# Patient Record
Sex: Male | Born: 1995 | Race: White | Hispanic: No | Marital: Single | Smoking: Current every day smoker
Health system: Southern US, Community
[De-identification: ages and names within clinical notes are randomized; demographics above are authoritative.]

## PROBLEM LIST (undated history)

## (undated) DIAGNOSIS — F419 Anxiety disorder, unspecified: Secondary | ICD-10-CM

## (undated) DIAGNOSIS — F329 Major depressive disorder, single episode, unspecified: Secondary | ICD-10-CM

## (undated) DIAGNOSIS — F32A Depression, unspecified: Secondary | ICD-10-CM

## (undated) HISTORY — DX: Depression, unspecified: F32.A

## (undated) HISTORY — DX: Anxiety disorder, unspecified: F41.9

## (undated) HISTORY — DX: Major depressive disorder, single episode, unspecified: F32.9

---
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
## 2015-01-11 ENCOUNTER — Other Ambulatory Visit: Payer: Self-pay | Admitting: Physician Assistant

## 2016-06-30 DIAGNOSIS — K080 Exfoliation of teeth due to systemic causes: Secondary | ICD-10-CM | POA: Diagnosis not present

## 2017-01-13 DIAGNOSIS — K080 Exfoliation of teeth due to systemic causes: Secondary | ICD-10-CM | POA: Diagnosis not present

## 2017-01-21 ENCOUNTER — Other Ambulatory Visit: Payer: Self-pay

## 2017-01-21 ENCOUNTER — Emergency Department (HOSPITAL_COMMUNITY): Payer: Federal, State, Local not specified - PPO

## 2017-01-21 ENCOUNTER — Ambulatory Visit (INDEPENDENT_AMBULATORY_CARE_PROVIDER_SITE_OTHER): Payer: Federal, State, Local not specified - PPO | Admitting: Family Medicine

## 2017-01-21 ENCOUNTER — Encounter (HOSPITAL_COMMUNITY): Payer: Self-pay | Admitting: Emergency Medicine

## 2017-01-21 ENCOUNTER — Encounter: Payer: Self-pay | Admitting: Family Medicine

## 2017-01-21 ENCOUNTER — Emergency Department (HOSPITAL_COMMUNITY)
Admission: EM | Admit: 2017-01-21 | Discharge: 2017-01-21 | Disposition: A | Discharge status: Home / Self Care | Payer: Federal, State, Local not specified - PPO | Attending: Emergency Medicine | Admitting: Emergency Medicine

## 2017-01-21 ENCOUNTER — Encounter (INDEPENDENT_AMBULATORY_CARE_PROVIDER_SITE_OTHER): Payer: Self-pay

## 2017-01-21 ENCOUNTER — Telehealth: Payer: Self-pay | Admitting: Family Medicine

## 2017-01-21 ENCOUNTER — Ambulatory Visit (HOSPITAL_COMMUNITY)
Admission: RE | Admit: 2017-01-21 | Discharge: 2017-01-21 | Disposition: A | Discharge status: Home / Self Care | Payer: Federal, State, Local not specified - PPO | Source: Ambulatory Visit | Attending: Family Medicine | Admitting: Family Medicine

## 2017-01-21 VITALS — BP 140/100 | HR 72 | Temp 98.8°F | Resp 16 | Ht 76.0 in | Wt 189.1 lb

## 2017-01-21 DIAGNOSIS — R030 Elevated blood-pressure reading, without diagnosis of hypertension: Secondary | ICD-10-CM

## 2017-01-21 DIAGNOSIS — Z23 Encounter for immunization: Secondary | ICD-10-CM | POA: Insufficient documentation

## 2017-01-21 DIAGNOSIS — R51 Headache: Secondary | ICD-10-CM

## 2017-01-21 DIAGNOSIS — F17210 Nicotine dependence, cigarettes, uncomplicated: Secondary | ICD-10-CM | POA: Insufficient documentation

## 2017-01-21 DIAGNOSIS — R519 Headache, unspecified: Secondary | ICD-10-CM

## 2017-01-21 DIAGNOSIS — R918 Other nonspecific abnormal finding of lung field: Secondary | ICD-10-CM | POA: Diagnosis not present

## 2017-01-21 DIAGNOSIS — R55 Syncope and collapse: Secondary | ICD-10-CM | POA: Diagnosis not present

## 2017-01-21 DIAGNOSIS — J301 Allergic rhinitis due to pollen: Secondary | ICD-10-CM

## 2017-01-21 DIAGNOSIS — J302 Other seasonal allergic rhinitis: Secondary | ICD-10-CM | POA: Insufficient documentation

## 2017-01-21 DIAGNOSIS — F418 Other specified anxiety disorders: Secondary | ICD-10-CM | POA: Diagnosis not present

## 2017-01-21 DIAGNOSIS — R040 Epistaxis: Secondary | ICD-10-CM

## 2017-01-21 DIAGNOSIS — L700 Acne vulgaris: Secondary | ICD-10-CM

## 2017-01-21 LAB — URINALYSIS, ROUTINE W REFLEX MICROSCOPIC
BILIRUBIN URINE: NEGATIVE
BILIRUBIN URINE: NEGATIVE
GLUCOSE, UA: NEGATIVE mg/dL
Glucose, UA: NEGATIVE
Hgb urine dipstick: NEGATIVE
Hgb urine dipstick: NEGATIVE
KETONES UR: 5 mg/dL — AB
LEUKOCYTES UA: NEGATIVE
Leukocytes, UA: NEGATIVE
Nitrite: NEGATIVE
Nitrite: NEGATIVE
PH: 6 (ref 5.0–8.0)
Protein, ur: NEGATIVE mg/dL
SPECIFIC GRAVITY, URINE: 1.03 (ref 1.001–1.035)
Specific Gravity, Urine: 1.02 (ref 1.005–1.030)
pH: 6 (ref 5.0–8.0)

## 2017-01-21 LAB — COMPLETE METABOLIC PANEL WITH GFR
ALBUMIN: 5.1 g/dL (ref 3.6–5.1)
ALK PHOS: 62 U/L (ref 40–115)
ALT: 10 U/L (ref 9–46)
AST: 14 U/L (ref 10–40)
BILIRUBIN TOTAL: 0.8 mg/dL (ref 0.2–1.2)
BUN: 15 mg/dL (ref 7–25)
CALCIUM: 10 mg/dL (ref 8.6–10.3)
CO2: 27 mmol/L (ref 20–31)
Chloride: 101 mmol/L (ref 98–110)
Creat: 1.2 mg/dL (ref 0.60–1.35)
GFR, EST NON AFRICAN AMERICAN: 87 mL/min (ref >=60)
GFR, Est African American: >89 mL/min (ref >=60)
Glucose, Bld: 86 mg/dL (ref 65–99)
POTASSIUM: 4.2 mmol/L (ref 3.5–5.3)
Sodium: 139 mmol/L (ref 135–146)
Total Protein: 7.6 g/dL (ref 6.1–8.1)

## 2017-01-21 LAB — BASIC METABOLIC PANEL
ANION GAP: 17 — AB (ref 5–15)
BUN: 19 mg/dL (ref 6–20)
CHLORIDE: 102 mmol/L (ref 101–111)
CO2: 20 mmol/L — AB (ref 22–32)
Calcium: 10.4 mg/dL — ABNORMAL HIGH (ref 8.9–10.3)
Creatinine, Ser: 1.3 mg/dL — ABNORMAL HIGH (ref 0.61–1.24)
GFR calc Af Amer: >60 mL/min (ref >60)
GLUCOSE: 117 mg/dL — AB (ref 65–99)
POTASSIUM: 2.8 mmol/L — AB (ref 3.5–5.1)
Sodium: 139 mmol/L (ref 135–145)

## 2017-01-21 LAB — LIPID PANEL
CHOL/HDL RATIO: 3.5 ratio (ref <5.0)
CHOLESTEROL: 136 mg/dL (ref <200)
HDL: 39 mg/dL — AB (ref >40)
LDL Cholesterol: 84 mg/dL (ref <100)
Triglycerides: 63 mg/dL (ref <150)
VLDL: 13 mg/dL (ref <30)

## 2017-01-21 LAB — URINALYSIS, MICROSCOPIC ONLY
BACTERIA UA: NONE SEEN [HPF]
CRYSTALS: NONE SEEN [HPF]
Casts: NONE SEEN [LPF]
SQUAMOUS EPITHELIAL / LPF: NONE SEEN [HPF] (ref <=5)
Yeast: NONE SEEN [HPF]

## 2017-01-21 LAB — CBC
HEMATOCRIT: 46.8 % (ref 38.5–50.0)
HEMATOCRIT: 47.3 % (ref 39.0–52.0)
HEMOGLOBIN: 16.3 g/dL (ref 13.2–17.1)
HEMOGLOBIN: 17.2 g/dL — AB (ref 13.0–17.0)
MCH: 30.6 pg (ref 27.0–33.0)
MCH: 30.4 pg (ref 26.0–34.0)
MCHC: 34.8 g/dL (ref 32.0–36.0)
MCHC: 36.4 g/dL — AB (ref 30.0–36.0)
MCV: 88 fL (ref 80.0–100.0)
MCV: 83.6 fL (ref 78.0–100.0)
MPV: 9.8 fL (ref 7.5–12.5)
Platelets: 222 10*3/uL (ref 140–400)
Platelets: 296 10*3/uL (ref 150–400)
RBC: 5.32 MIL/uL (ref 4.20–5.80)
RBC: 5.66 MIL/uL (ref 4.22–5.81)
RDW: 13.2 % (ref 11.0–15.0)
RDW: 12.2 % (ref 11.5–15.5)
WBC: 6.1 10*3/uL (ref 3.8–10.8)
WBC: 9.7 10*3/uL (ref 4.0–10.5)

## 2017-01-21 LAB — CBG MONITORING, ED: GLUCOSE-CAPILLARY: 86 mg/dL (ref 65–99)

## 2017-01-21 LAB — TSH: TSH: 3.47 mIU/L (ref 0.40–4.50)

## 2017-01-21 MED ORDER — CLINDAMYCIN PHOS-BENZOYL PEROX 1-5 % EX GEL: Freq: Two times a day (BID) | CUTANEOUS | 0 refills | Status: AC

## 2017-01-21 MED ORDER — SODIUM CHLORIDE 0.9 % IV BOLUS (SEPSIS)
1000.0000 mL | Freq: Once | INTRAVENOUS | Status: AC
  Administered 2017-01-21: 1000 mL via INTRAVENOUS

## 2017-01-21 MED ORDER — ESCITALOPRAM OXALATE 20 MG PO TABS: ORAL_TABLET | ORAL | 11 refills | Status: AC

## 2017-01-21 MED ORDER — POTASSIUM CHLORIDE CRYS ER 20 MEQ PO TBCR
40.0000 meq | EXTENDED_RELEASE_TABLET | Freq: Once | ORAL | Status: AC
  Administered 2017-01-21: 40 meq via ORAL
  Filled 2017-01-21: qty 2

## 2017-01-21 MED ORDER — PROCHLORPERAZINE EDISYLATE 5 MG/ML IJ SOLN
10.0000 mg | Freq: Once | INTRAMUSCULAR | Status: AC
  Administered 2017-01-21: 10 mg via INTRAVENOUS
  Filled 2017-01-21: qty 2

## 2017-01-21 NOTE — ED Notes (Signed)
Ambulated to restroom independently  

## 2017-01-21 NOTE — Progress Notes (Signed)
Chief Complaint  Patient presents with  . Epistaxis    tuesday  . Headache   New to establish with office Here because of a recent event at home, 2 days ago Awakened at 3 am with a headache and ringing in his ears.  He went into the bathroom to splash water in his face, and noticed a nosebleed.  Does not remember anything after that except for waking up on his parent's room floor.  His father is in the room and adds that they heard him come into the room,and that he answered questions, but was "out of it" and slightly dazed in appearance.  NO true LOC.  Did not fall or hit head. The nosebleed was controlled with pressure. The parents cleaned him up and put him back to bed. He complained of a headache and ringing of his ears. Father cleaned up the floor and then went to check on noah, he was well resting in his bed. He states he had nosebleeds as a child but has not had that in many years. He has not had any recent allergy symptoms, blowing his nose, or sneezing. He has not had a nosebleed since that. Another problem identified his anxiety and depression. This is been going off and on since he was a young teen. He worries a lot. He stresses over small events. He is easily upset. Easily irritable. Easily tearful. He sleeps during the day and is up at night. Spends a lot of time playing computer games. He lives at home with his parents and siblings. He does not work. He does not drive. He finished high school but has not yet made plans for additional educational work. He thinks he would be interested in IT. He knows that he can study computers online but has not had the motivation to sign up. Sometimes feels hopeless and helpless. Self-esteem is good. He gets no exercise. He does eat well. He has acne. He had it for many years. He uses a face wash but no other products. Father senses shots are all up-to-date. It is unknown whether he had the Gardasil series. The importance of immunizing against HPV is  discussed with them.  Patient Active Problem List   Diagnosis Date Noted  . Seasonal allergies 01/21/2017  . Acne vulgaris 01/21/2017  . Anxiety with depression 01/21/2017  . Elevated BP without diagnosis of hypertension 01/21/2017  . Need for HPV vaccination 01/21/2017    Outpatient Encounter Prescriptions as of 01/21/2017  Medication Sig  . acetaminophen (TYLENOL) 325 MG tablet Take 650 mg by mouth every 6 (six) hours as needed.  . clindamycin-benzoyl peroxide (BENZACLIN) gel Apply topically 2 (two) times daily.  Marland Kitchen escitalopram (LEXAPRO) 20 MG tablet Half tab daily for a week then one tab a day   No facility-administered encounter medications on file as of 01/21/2017.     Past Medical History:  Diagnosis Date  . Anxiety   . Depression     History reviewed. No pertinent surgical history.  Social History   Social History  . Marital status: Single    Spouse name: N/A  . Number of children: N/A  . Years of education: HS   Occupational History  . unemployed    Social History Main Topics  . Smoking status: Current Every Day Smoker    Types: E-cigarettes    Start date: 09/08/2015  . Smokeless tobacco: Never Used  . Alcohol use No  . Drug use: No  . Sexual activity: Not Currently  Other Topics Concern  . Not on file   Social History Narrative   Lives at home with parents,  Twin sister and brother   Gamer at night   Sleeper during day    Family History  Problem Relation Age of Onset  . Depression Father   . Heart disease Maternal Grandfather 44       massive MI  . Heart disease Paternal Grandfather 45       heart    Review of Systems  Constitutional: Positive for malaise/fatigue. Negative for weight loss.       Daytime fatigue  HENT: Positive for nosebleeds and tinnitus. Negative for congestion and sinus pain.   Eyes: Positive for blurred vision. Negative for double vision.       Question whether distant vision is declining  Respiratory: Negative for  sputum production and shortness of breath.   Cardiovascular: Negative for chest pain, palpitations and orthopnea.  Gastrointestinal: Negative for abdominal pain, constipation, diarrhea and heartburn.  Genitourinary: Negative for dysuria and urgency.  Musculoskeletal: Negative for myalgias and neck pain.  Neurological: Positive for dizziness and headaches.       Last 2 days  Psychiatric/Behavioral: Positive for depression. Negative for suicidal ideas. The patient is nervous/anxious and has insomnia.     BP (!) 140/100 (BP Location: Left Arm, Patient Position: Sitting)   Pulse 72   Temp 98.8 F (37.1 C) (Temporal)   Resp 16   Ht 6\' 4"  (1.93 m)   Wt 189 lb 1.9 oz (85.8 kg)   SpO2 99%   BMI 23.02 kg/m   Physical Exam  Constitutional: He is oriented to person, place, and time. He appears well-developed and well-nourished.  HENT:  Head: Normocephalic and atraumatic.  Right Ear: External ear normal.  Left Ear: External ear normal.  Mouth/Throat: Oropharynx is clear and moist.  Eyes: Conjunctivae are normal. Pupils are equal, round, and reactive to light.  Neck: Normal range of motion. Neck supple. No thyromegaly present.  Cardiovascular: Normal rate, regular rhythm and normal heart sounds.   Pulmonary/Chest: Effort normal and breath sounds normal. No respiratory distress.  Abdominal: Soft. Bowel sounds are normal.  Musculoskeletal: Normal range of motion. He exhibits no edema.  Lymphadenopathy:    He has no cervical adenopathy.  Neurological: He is alert and oriented to person, place, and time.  Gait normal  Skin: Skin is warm and dry.  Acne vulgaris on face  Psychiatric: He has a normal mood and affect. His speech is normal. Judgment and thought content normal. He is withdrawn. Cognition and memory are normal.  Slightly hesitant.reserved. Looks a father for answers. Polite, intelligent, articulate. Mildly hyperkinetic/jittery  Nursing note and vitals  reviewed. ASSESSMENT/PLAN:   1. Seasonal allergic rhinitis due to pollen   2. Elevated BP without diagnosis of hypertension - CBC - COMPLETE METABOLIC PANEL WITH GFR - Lipid panel - TSH - Urinalysis, Routine w reflex microscopic - DG Chest 2 View; Future - EKG 12-Lead - US Renal Artery Stenosis; Future  3. Epistaxis  4. Anxiety with depression - escitalopram (LEXAPRO) 20 MG tablet; Half tab daily for a week then one tab a day  Dispense: 30 tablet; Refill: 11  5. Acne vulgaris - clindamycin-benzoyl peroxide (BENZACLIN) gel; Apply topically 2 (two) times daily.  Dispense: 25 g; Refill:   Greater than 50% of this visit was spent in counseling and coordinating care.  As above discussed hypertension in the adolescent and necessary workup.  Discussed anxiety and derepression and treatment  options. Total face to face time:  60 min   Patient Instructions  Walk every day that you are able Need: Blood tests - today Chest x ray Ultrasound kidneys  Take the lexapro every day Start with 10 mg a day then increase to 20 mg a day until return  Use clindamycin gel twice a day This helps with acne  See me in 2 weeks     Eustace MooreYvonne Sue Aine Strycharz, MD

## 2017-01-21 NOTE — ED Triage Notes (Addendum)
Patient c/o severe headache to left side of head. Patient woke at 3am On Tuesday with headache, patient went to tell parents and "passed out-nose started bleeding. Patient seen by PCP yesterday and was told blood pressure was high and could be related to kidney function. Patient found in floor today by brother, diaphoretic with chills. Unsure of any fevers. Patient had nose bleed today as well and reports numbness in arms bilaterally. Patient had witnessed syncopal episode on way to hospital, lasting approx 2 minutes per brother. Patient alert and oriented at this time. Denies any sl;urred speech or facial drooping-generalized weakness.

## 2017-01-21 NOTE — Discharge Instructions (Signed)
Continue using Tylenol, or Motrin as needed for headache.  Sure that you are getting plenty of rest, and drinking and eating regularly.  To avoid video screens for a week.

## 2017-01-21 NOTE — ED Provider Notes (Signed)
[facility]-EMERGENCY DEPT Provider Note   CSN: [id number] Arrival date & time: 01/21/17  1848     History   Chief Complaint Chief Complaint  Patient presents with  . Loss of Consciousness    HPI Christian Sweeney is a 21 y.o. male.  He presents for evaluation of headache with syncope.  First noticed headache, 3 days ago, and later that night, passed out for short period, while he was talking to his parents about headache.  Today he went to see her PCP regarding the difficulty, and had screening labs done and was set up for ultrasound of kidneys because his blood pressure was high.  Is also had intermittent bleeding from the nostrils, several times in the last couple of days.  He has a chronic history of nosebleeds.  He is otherwise healthy.  He is passed out a total of 3 times for short period, each time with your headache.  There has been no fever, chills, nausea, vomiting, focal weakness or dizziness.  There are no other known modifying factors.  HPI  Past Medical History:  Diagnosis Date  . Anxiety   . Depression     Patient Active Problem List   Diagnosis Date Noted  . Seasonal allergies 01/21/2017  . Acne vulgaris 01/21/2017  . Anxiety with depression 01/21/2017  . Elevated BP without diagnosis of hypertension 01/21/2017  . Need for HPV vaccination 01/21/2017    History reviewed. No pertinent surgical history.     Home Medications    Prior to Admission medications   Medication Sig Start Date End Date Taking? Authorizing Provider  acetaminophen (TYLENOL) 500 MG tablet Take 1,000 mg by mouth every 6 (six) hours as needed for mild pain, moderate pain, fever or headache.   Yes [provider]  clindamycin-benzoyl peroxide (BENZACLIN) gel Apply topically 2 (two) times daily. 01/21/17   Eustace MooreNelson, Yvonne Sue, MD  escitalopram Judye Bos(LEXAPRO) 20 MG tablet Half tab daily for a week then one tab a day 01/21/17   Eustace MooreNelson, Yvonne Sue, MD    Family History Family History  Problem  Relation Age of Onset  . Depression Father   . Heart disease Maternal Grandfather [id number]       massive MI  . Heart disease Paternal Grandfather 4766       heart    Social History Social History  Substance Use Topics  . Smoking status: Current Every Day Smoker    Types: E-cigarettes    Start date: 09/08/2015  . Smokeless tobacco: Never Used  . Alcohol use No     Allergies   Patient has no known allergies.   Review of Systems Review of Systems  All other systems reviewed and are negative.    Physical Exam Updated Vital Signs BP 140/83 (BP Location: Left Arm)   Pulse 88   Temp 98.8 F (37.1 C) (Oral)   Resp 16   Ht 6\' 4"  (1.93 m)   Wt 189 lb (85.7 kg)   SpO2 99%   BMI 23.01 kg/m   Physical Exam  Constitutional: He is oriented to person, place, and time. He appears well-developed and well-nourished.  HENT:  Head: Normocephalic and atraumatic.  Right Ear: External ear normal.  Left Ear: External ear normal.  Eyes: Conjunctivae and EOM are normal. Pupils are equal, round, and reactive to light.  Neck: Normal range of motion and phonation normal. Neck supple.  Cardiovascular: Normal rate, regular rhythm and normal heart sounds.   Pulmonary/Chest: Effort normal and breath sounds normal.  He exhibits no bony tenderness.  Abdominal: Soft. There is no tenderness.  Musculoskeletal: Normal range of motion.  Neurological: He is alert and oriented to person, place, and time. No cranial nerve deficit or sensory deficit. He exhibits normal muscle tone. Coordination normal.  No dysarthria, aphasia or nystagmus.  No ataxia.  No pronator drift.  Skin: Skin is warm, dry and intact.  Psychiatric: He has a normal mood and affect. His behavior is normal. Judgment and thought content normal.  Nursing note and vitals reviewed.    ED Treatments / Results  Labs (all labs ordered are listed, but only abnormal results are displayed) Labs Reviewed  BASIC METABOLIC PANEL - Abnormal; Notable  for the following:       Result Value   Potassium 2.8 (*)    CO2 20 (*)    Glucose, Bld 117 (*)    Creatinine, Ser 1.30 (*)    Calcium 10.4 (*)    Anion gap 17 (*)    All other components within normal limits  CBC - Abnormal; Notable for the following:    Hemoglobin 17.2 (*)    MCHC 36.4 (*)    All other components within normal limits  URINALYSIS, ROUTINE W REFLEX MICROSCOPIC - Abnormal; Notable for the following:    Ketones, ur 5 (*)    All other components within normal limits  CBG MONITORING, ED  CBG MONITORING, ED    EKG  EKG Interpretation None       Rate: 103  Rhythm: normal sinus rhythm  QRS Axis: normal  PR and QT Intervals: normal  ST/T Wave abnormalities: normal  PR and QRS Conduction Disutrbances:none  Narrative Interpretation:   Old EKG Reviewed: unchanged    Radiology Dg Chest 2 View  Result Date: 01/21/2017 CLINICAL DATA:  Htn/dizziness/headache/passed out on Tuesday/smoker x 1 1/2 year EXAM: CHEST  2 VIEW COMPARISON:  None. FINDINGS: Lungs are hyperinflated. Heart size is normal. There are no focal consolidations or pleural effusions. No pulmonary edema. IMPRESSION: 1. Hyperinflation. 2.  No evidence for acute cardiopulmonary abnormality. Electronically Signed   By: Norva Pavlov M.D.   On: 01/21/2017 10:09   Ct Head Wo Contrast  Result Date: 01/21/2017 CLINICAL DATA:  Severe left-sided headache for 2 days. Syncopal episode. Epistaxis. EXAM: CT HEAD WITHOUT CONTRAST TECHNIQUE: Contiguous axial images were obtained from the base of the skull through the vertex without intravenous contrast. COMPARISON:  None. FINDINGS: Brain: No evidence of acute infarction, hemorrhage, hydrocephalus, extra-axial collection or mass lesion/mass effect. Vascular: No hyperdense vessel or unexpected calcification. Skull: Normal. Negative for fracture or focal lesion. Sinuses/Orbits: No acute finding. Other: None. IMPRESSION: Negative noncontrast head CT. Electronically Signed    By: Myles Rosenthal M.D.   On: 01/21/2017 19:27    Procedures Procedures (including critical care time)  Medications Ordered in ED Medications  sodium chloride 0.9 % bolus 1,000 mL (0 mLs Intravenous Stopped 01/21/17 2149)  prochlorperazine (COMPAZINE) injection 10 mg (10 mg Intravenous Given 01/21/17 1953)  potassium chloride SA (K-DUR,KLOR-CON) CR tablet 40 mEq (40 mEq Oral Given 01/21/17 1953)     Initial Impression / Assessment and Plan / ED Course  I have reviewed the triage vital signs and the nursing notes.  Pertinent labs & imaging results that were available during my care of the patient were reviewed by me and considered in my medical decision making (see chart for details).     Medications  sodium chloride 0.9 % bolus 1,000 mL (0 mLs Intravenous Stopped  01/21/17 2149)  prochlorperazine (COMPAZINE) injection 10 mg (10 mg Intravenous Given 01/21/17 1953)  potassium chloride SA (K-DUR,KLOR-CON) CR tablet 40 mEq (40 mEq Oral Given 01/21/17 1953)    Patient Vitals for the past 24 hrs:  BP Temp Temp src Pulse Resp SpO2 Height Weight  01/21/17 2058 140/83 - - 88 16 99 % - -  01/21/17 2000 126/78 - - 90 17 98 % - -  01/21/17 1955 (!) 154/96 - - (!) 113 18 100 % - -  01/21/17 1858 (!) 162/97 98.8 F (37.1 C) Oral 93 (!) 22 100 % - -  01/21/17 1856 - - - - - - 6\' 4"  (1.93 m) 189 lb (85.7 kg)    At discharge- reevaluation with update and discussion. After initial assessment and treatment, an updated evaluation reveals headache resolved completely.  Vital signs normal.  Patient states his headaches have been intermittent in the last 3 days, lasting usually about 10 minutes, then having intervals of complete pain freedom, followed by a headache, up to 8 times each day.  Before this he has had similar headaches but not so frequently.  He has never been evaluated by a neurologist for this.  He has been using Tylenol, at home with partial relief of the discomfort.  He does play video games  about 2 hours each day and watches television a lot.  Findings discussed with patient and family members, all questions answered. Rayquan Amrhein L    Final Clinical Impressions(s) / ED Diagnoses   Final diagnoses:  Syncope, unspecified syncope type  Nonintractable episodic headache, unspecified headache type    Nonspecific frequent headaches, with intervening pain-free times.  Syncope without findings for cardiac instability.  Headache pattern is not clear but could represent cluster headaches.  This could also be a migraine variant.  Doubt subarachnoid hemorrhage, meningitis, radicular pain.  Nursing Notes Reviewed/ Care Coordinated Applicable Imaging Reviewed Interpretation of Laboratory Data incorporated into ED treatment  The patient appears reasonably screened and/or stabilized for discharge and I doubt any other medical condition or other [facility] requiring further screening, evaluation, or treatment in the ED at this time prior to discharge.  Plan: Home Medications-OTC analgesia; Home Treatments-rest, fluids; return here if the recommended treatment, does not improve the symptoms; Recommended follow up-neurology follow-up for definitive diagnosis and management     New Prescriptions Discharge Medication List as of 01/21/2017  9:21 PM       Mancel Bale, MD 01/21/17 2205

## 2017-01-21 NOTE — ED Notes (Signed)
Patient transported to CT 

## 2017-01-21 NOTE — Patient Instructions (Signed)
Walk every day that you are able Need: Blood tests - today Chest x ray Ultrasound kidneys  Take the lexapro every day Start with 10 mg a day then increase to 20 mg a day until return  Use clindamycin gel twice a day This helps with acne  See me in 2 weeks

## 2017-01-22 ENCOUNTER — Encounter: Payer: Self-pay | Admitting: Family Medicine

## 2017-01-27 ENCOUNTER — Ambulatory Visit (HOSPITAL_COMMUNITY)
Admission: RE | Admit: 2017-01-27 | Discharge: 2017-01-27 | Disposition: A | Discharge status: Home / Self Care | Payer: Federal, State, Local not specified - PPO | Source: Ambulatory Visit | Attending: Family Medicine | Admitting: Family Medicine

## 2017-01-27 DIAGNOSIS — R030 Elevated blood-pressure reading, without diagnosis of hypertension: Secondary | ICD-10-CM | POA: Diagnosis not present

## 2017-01-27 NOTE — Progress Notes (Signed)
Preliminary results by tech - Renal Duplex Completed. Bilateral renal arteries are patent without evidence of stenosis or vascular abnormalities. Christian Sweeney, BS, RDMS, RVT

## 2017-02-04 ENCOUNTER — Ambulatory Visit (INDEPENDENT_AMBULATORY_CARE_PROVIDER_SITE_OTHER): Payer: Federal, State, Local not specified - PPO | Admitting: Family Medicine

## 2017-02-04 ENCOUNTER — Encounter: Payer: Self-pay | Admitting: Family Medicine

## 2017-02-04 VITALS — BP 128/68 | HR 80 | Temp 98.4°F | Resp 16 | Ht 76.0 in | Wt 191.1 lb

## 2017-02-04 DIAGNOSIS — R55 Syncope and collapse: Secondary | ICD-10-CM

## 2017-02-04 NOTE — Patient Instructions (Signed)
I will contact you after testing

## 2017-02-04 NOTE — Progress Notes (Signed)
Called pt, aware of us results.

## 2017-02-04 NOTE — Progress Notes (Signed)
Chief Complaint  Patient presents with  . Follow-up    2 week  test results reviewed Heat CT pictures showed to patient Labs normal Kidney vascular study normal EKG normal He has fainted again since last seen/ ER notes and results reviewed and discussed. BP is down Feels more calm No more nosebleeds    Patient Active Problem List   Diagnosis Date Noted  . Seasonal allergies 01/21/2017  . Acne vulgaris 01/21/2017  . Anxiety with depression 01/21/2017  . Elevated BP without diagnosis of hypertension 01/21/2017  . Need for HPV vaccination 01/21/2017    Outpatient Encounter Prescriptions as of 02/04/2017  Medication Sig  . acetaminophen (TYLENOL) 500 MG tablet Take 1,000 mg by mouth every 6 (six) hours as needed for mild pain, moderate pain, fever or headache.  . clindamycin-benzoyl peroxide (BENZACLIN) gel Apply topically 2 (two) times daily.  Marland Kitchen. escitalopram (LEXAPRO) 20 MG tablet Half tab daily for a week then one tab a day   No facility-administered encounter medications on file as of 02/04/2017.     No Known Allergies  Review of Systems  Constitutional: Negative for activity change, appetite change and unexpected weight change.  HENT: Negative for congestion, dental problem and nosebleeds.   Eyes: Negative for photophobia and visual disturbance.  Respiratory: Negative for cough and shortness of breath.   Cardiovascular: Negative for chest pain and palpitations.  Gastrointestinal: Negative for constipation and diarrhea.  Genitourinary: Negative for difficulty urinating and frequency.  Musculoskeletal: Negative for arthralgias and back pain.  Skin: Negative for rash.       Acne improving  Neurological: Negative for dizziness, syncope and headaches.       None since ER  Psychiatric/Behavioral: Positive for sleep disturbance. Negative for behavioral problems and dysphoric mood. The patient is not nervous/anxious.     BP 128/68 (BP Location: Right Arm, Patient  Position: Sitting, Cuff Size: Normal)   Pulse 80   Temp 98.4 F (36.9 C) (Temporal)   Resp 16   Ht 6\' 4"  (1.93 m)   Wt 191 lb 1.3 oz (86.7 kg)   SpO2 99%   BMI 23.26 kg/m   Physical Exam  Constitutional: He is oriented to person, place, and time. He appears well-developed and well-nourished.  Tall, thin  HENT:  Head: Normocephalic and atraumatic.  Right Ear: External ear normal.  Left Ear: External ear normal.  Mouth/Throat: Oropharynx is clear and moist.  Eyes: Conjunctivae are normal. Pupils are equal, round, and reactive to light.  Neck: Normal range of motion. Neck supple. No thyromegaly present.  Cardiovascular: Normal rate, regular rhythm and normal heart sounds.   Pulmonary/Chest: Effort normal and breath sounds normal. No respiratory distress.  Abdominal: Soft. Bowel sounds are normal.  Musculoskeletal: Normal range of motion. He exhibits no edema.  Lymphadenopathy:    He has no cervical adenopathy.  Neurological: He is alert and oriented to person, place, and time. He displays normal reflexes.  Gait normal  Skin: Skin is warm and dry.  Psychiatric: He has a normal mood and affect. His behavior is normal. Thought content normal.  Nursing note and vitals reviewed.   ASSESSMENT/PLAN:  1. Syncope and collapse  - Ambulatory referral to Neurology - ECHOCARDIOGRAM COMPLETE; Future  Discussed benign vasovagal syncope as likely etiology.  Possible cardiac, but less likely.  Possible neurologic ( migraine variant, Sz) but less likely.  Will test and follow  Patient Instructions  I will contact you after testing     Letta PateYvonne Sue  Meda Coffee, MD

## 2017-04-06 ENCOUNTER — Ambulatory Visit: Payer: Self-pay | Admitting: Neurology

## 2017-04-06 ENCOUNTER — Telehealth: Payer: Self-pay | Admitting: Neurology

## 2017-04-06 NOTE — Telephone Encounter (Signed)
This patient did not show for a new patient appointment today. 

## 2017-04-07 ENCOUNTER — Encounter: Payer: Self-pay | Admitting: Neurology

## 2017-12-08 IMAGING — CT CT HEAD W/O CM
4 series · 16 of 47 positions shown, 18 images · non-contrast
Comparison: None.

CLINICAL DATA: Severe left-sided headache for 2 days. Syncopal
episode. Epistaxis.

EXAM:
CT HEAD WITHOUT CONTRAST
TECHNIQUE: Contiguous axial images were obtained from the base of the skull
through the vertex without intravenous contrast.

[Series 2: head trauma wo · axial · 0.47mm/px · z∈[+172,+282]mm · 7 of 30 slices shown, 9 images]
[im 4/30  brain]
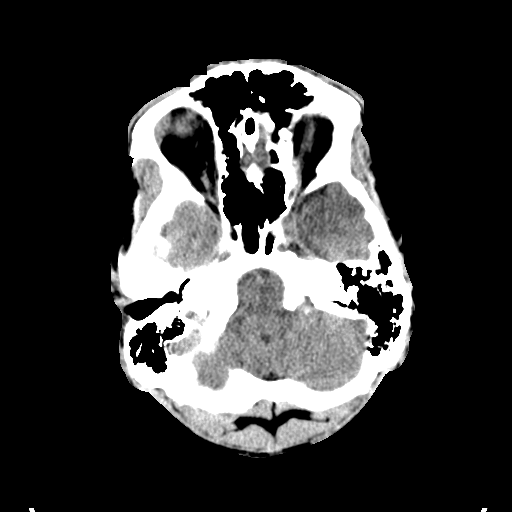
[im 4/30  bone]
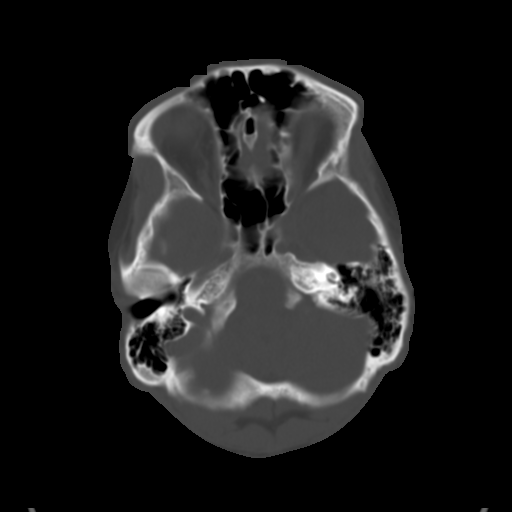
[im 8/30  brain]
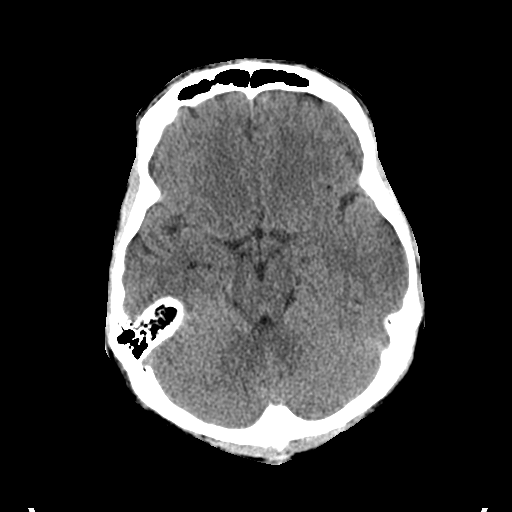
[im 11/30  brain]
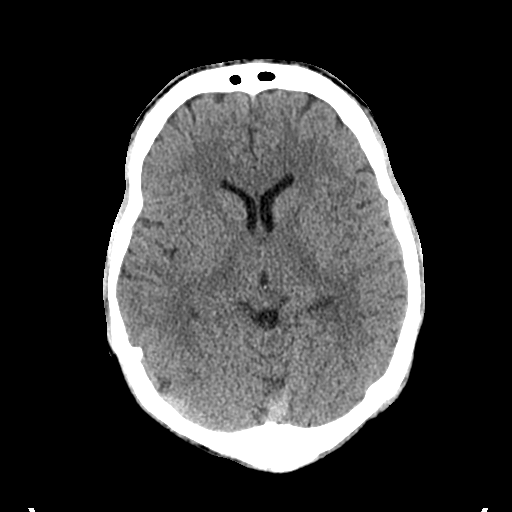
[im 15/30  brain]
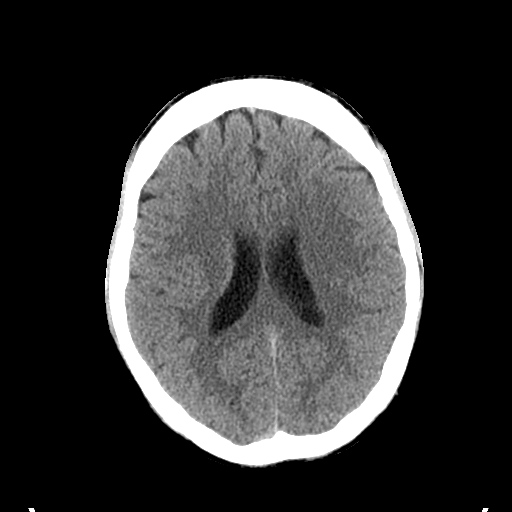
[im 19/30  brain]
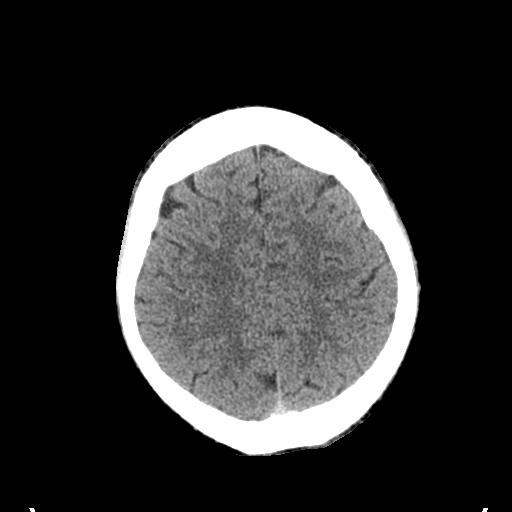
[im 19/30  bone]
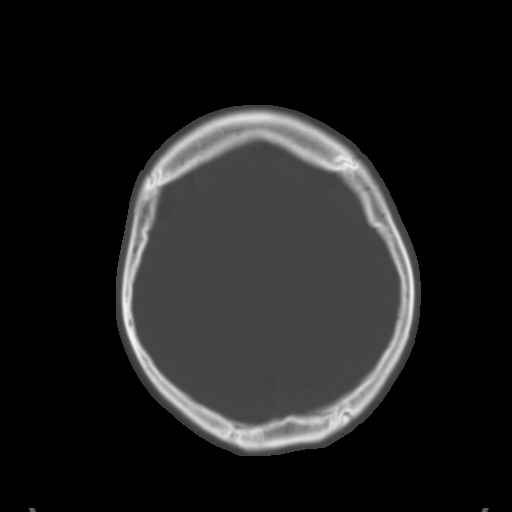
[im 22/30  brain]
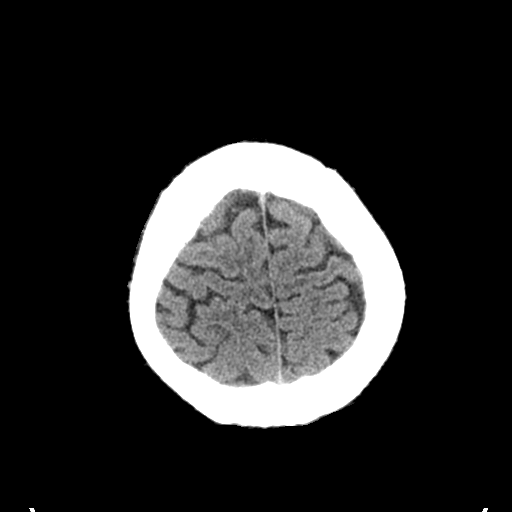
[im 26/30  brain]
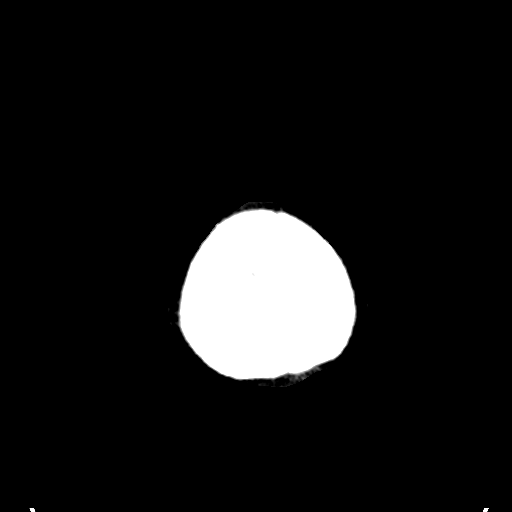

[Series 3: head bone · axial · 0.47mm/px · z∈[+171,+201]mm · 3 of 75 slices shown]
[im 8/75  bone]
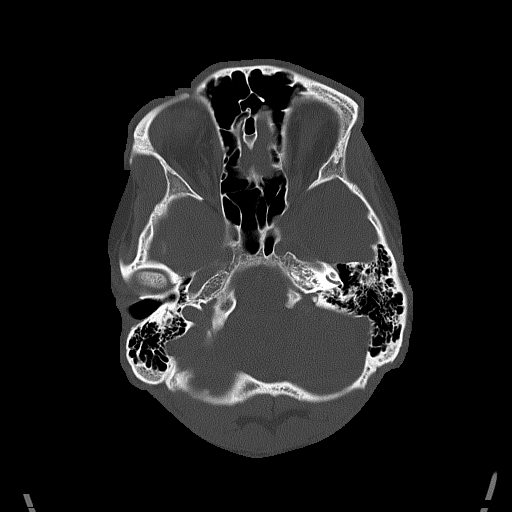
[im 15/75  bone]
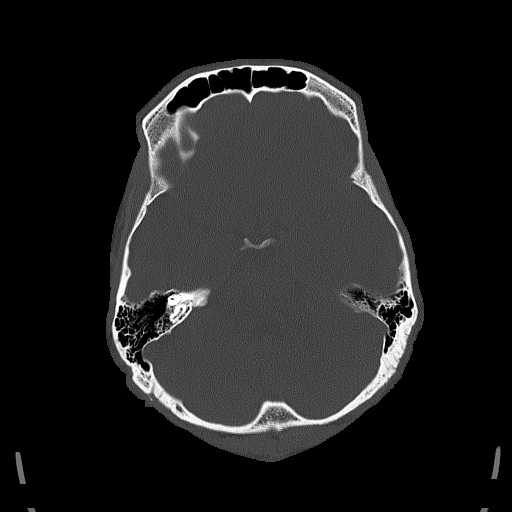
[im 23/75  bone]
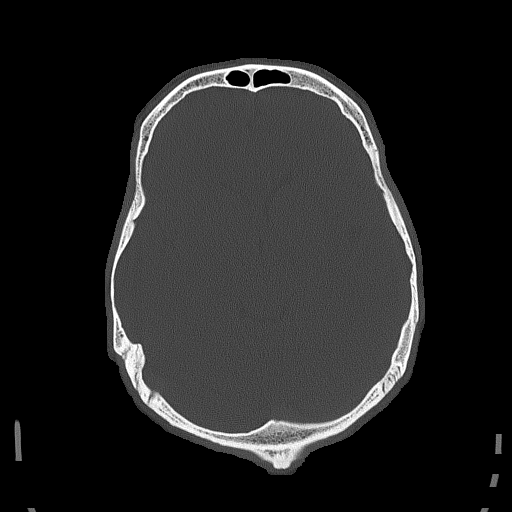

[Series 4: coronal soft tissue · coronal · 0.27mm/px · 3 of 68 slices shown]
[im 23/68  brain]
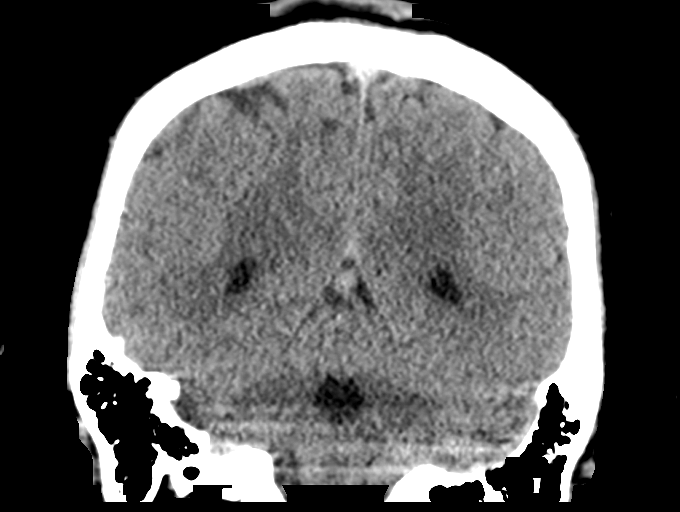
[im 30/68  brain]
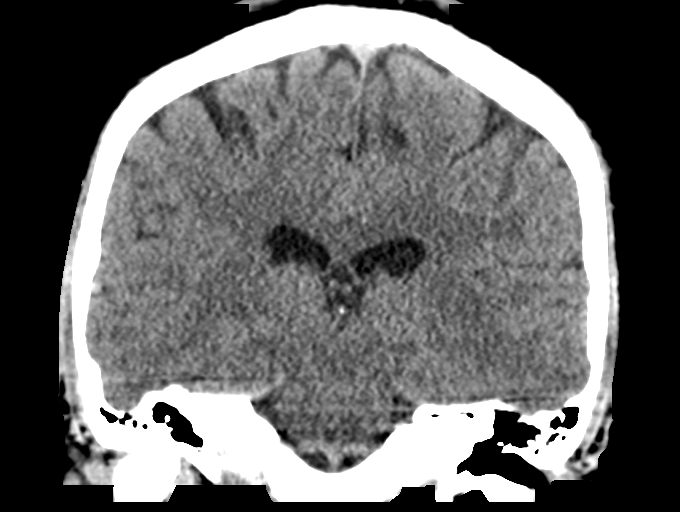
[im 38/68  brain]
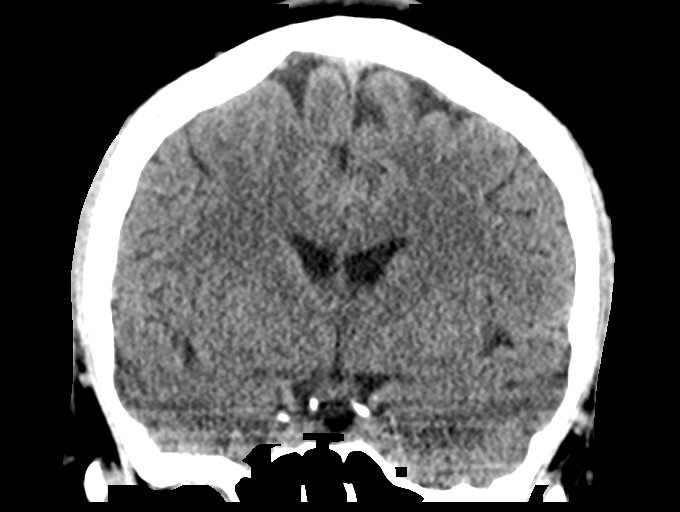

[Series 5: sagittal soft tissue · sagittal · 0.30mm/px · 3 of 53 slices shown]
[im 18/53  brain]
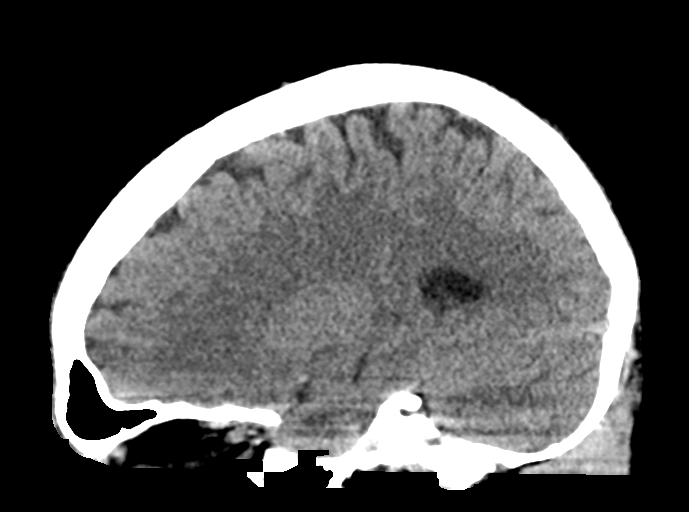
[im 27/53  brain]
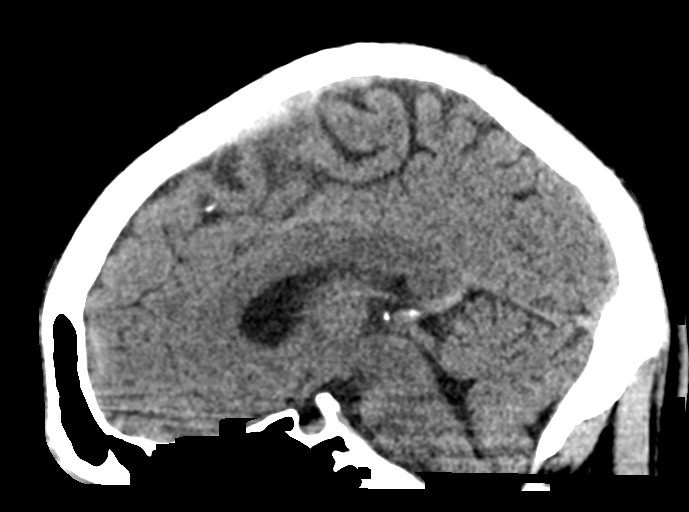
[im 35/53  brain]
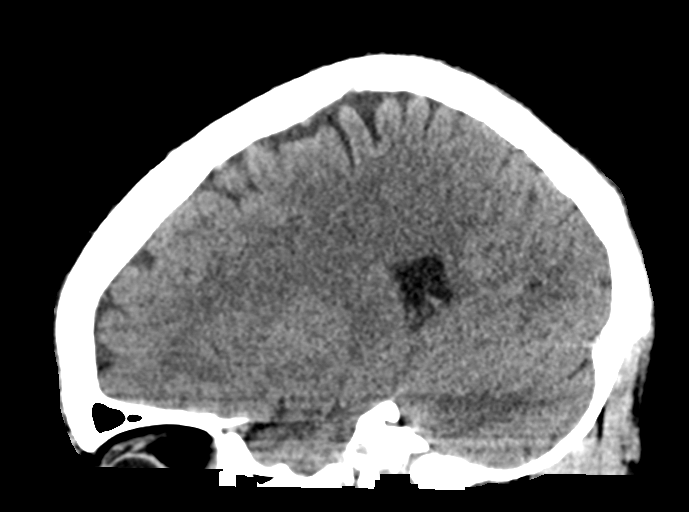

[16 of 47 positions shown; findings below may reference images not displayed]

FINDINGS: Brain: No evidence of acute infarction, hemorrhage, hydrocephalus,
extra-axial collection or mass lesion/mass effect.

Vascular: No hyperdense vessel or unexpected calcification.

Skull: Normal. Negative for fracture or focal lesion.

Sinuses/Orbits: No acute finding.

Other: None.
IMPRESSION: Negative noncontrast head CT.

## 2018-02-11 ENCOUNTER — Encounter: Payer: Self-pay | Admitting: Family Medicine

## 2018-07-05 DIAGNOSIS — K080 Exfoliation of teeth due to systemic causes: Secondary | ICD-10-CM | POA: Diagnosis not present

## 2019-03-29 ENCOUNTER — Other Ambulatory Visit: Payer: Self-pay

## 2019-03-29 ENCOUNTER — Other Ambulatory Visit: Payer: Federal, State, Local not specified - PPO

## 2019-03-29 DIAGNOSIS — Z20822 Contact with and (suspected) exposure to covid-19: Secondary | ICD-10-CM

## 2019-03-29 DIAGNOSIS — R6889 Other general symptoms and signs: Secondary | ICD-10-CM | POA: Diagnosis not present

## 2019-04-02 LAB — NOVEL CORONAVIRUS, NAA: SARS-CoV-2, NAA: NOT DETECTED

## 2019-10-17 ENCOUNTER — Ambulatory Visit
Admission: EM | Admit: 2019-10-17 | Discharge: 2019-10-17 | Disposition: A | Discharge status: Home / Self Care | Payer: Federal, State, Local not specified - PPO | Attending: Emergency Medicine | Admitting: Emergency Medicine

## 2019-10-17 ENCOUNTER — Other Ambulatory Visit: Payer: Self-pay

## 2019-10-17 DIAGNOSIS — Z20822 Contact with and (suspected) exposure to covid-19: Secondary | ICD-10-CM

## 2019-10-17 MED ORDER — FLUTICASONE PROPIONATE 50 MCG/ACT NA SUSP: 1.0000 | Freq: Every day | NASAL | 0 refills | Status: AC

## 2019-10-17 NOTE — Discharge Instructions (Addendum)
COVID testing ordered.  It will take between 2-7 days for test results.  Someone will contact you regarding abnormal results.    In the meantime: You should remain isolated in your home for 10 days from symptom onset AND greater than 72 hours after symptoms resolution (absence of fever without the use of fever-reducing medication and improvement in respiratory symptoms), whichever is longer Get plenty of rest and push fluids Flonase prescribed for nasal congestion and runny nose Use medications daily for symptom relief Use OTC medications like ibuprofen or tylenol as needed fever or pain Call or go to the ED if you have any new or worsening symptoms such as fever, worsening cough, shortness of breath, chest tightness, chest pain, turning blue, changes in mental status, etc...  

## 2019-10-17 NOTE — ED Provider Notes (Signed)
[facility]    CSN: [id number] Arrival date & time: 10/17/19  [place]      History   Chief Complaint Chief Complaint  Patient presents with  . Nasal Congestion    HPI Christian Sweeney is a 24 y.o. male.   Who presented to the urgent care with a complaint of nasal congestion, body aches for the past 3 days.  Denies sick exposure to COVID, flu or strep.  Denies recent travel.  Denies aggravating or alleviating symptoms.  Denies previous COVID infection.   Denies fever, chills, fatigue,  rhinorrhea, sore throat, cough, SOB, wheezing, chest pain, nausea, vomiting, changes in bowel or bladder habits.    The history is provided by the patient. No language interpreter was used.    Past Medical History:  Diagnosis Date  . Anxiety   . Depression     Patient Active Problem List   Diagnosis Date Noted  . Seasonal allergies 01/21/2017  . Acne vulgaris 01/21/2017  . Anxiety with depression 01/21/2017  . Elevated BP without diagnosis of hypertension 01/21/2017  . Need for HPV vaccination 01/21/2017    History reviewed. No pertinent surgical history.     Home Medications    Prior to Admission medications   Medication Sig Start Date End Date Taking? Authorizing Provider  acetaminophen (TYLENOL) 500 MG tablet Take 1,000 mg by mouth every 6 (six) hours as needed for mild pain, moderate pain, fever or headache.    [provider]  clindamycin-benzoyl peroxide (BENZACLIN) gel Apply topically 2 (two) times daily. 01/21/17   Raylene Everts, MD  escitalopram (LEXAPRO) 20 MG tablet Half tab daily for a week then one tab a day 01/21/17   Raylene Everts, MD  fluticasone [facility]) 50 MCG/ACT nasal spray Place 1 spray into both nostrils daily for 14 days. 10/17/19 10/31/19  Emerson Monte, FNP    Family History Family History  Problem Relation Age of Onset  . Depression Father   . Heart disease Maternal Grandfather 106       massive MI  . Heart disease Paternal  Grandfather 21       heart    Social History Social History   Tobacco Use  . Smoking status: Current Every Day Smoker    Types: E-cigarettes    Start date: 09/08/2015  . Smokeless tobacco: Never Used  Substance Use Topics  . Alcohol use: No  . Drug use: No     Allergies   Patient has no known allergies.   Review of Systems Review of Systems  Constitutional: Negative.   HENT: Positive for congestion.   Respiratory: Negative.   Cardiovascular: Negative.   Gastrointestinal: Negative.   Musculoskeletal:       Body ache  Neurological: Negative.      Physical Exam Triage Vital Signs ED Triage Vitals  Enc Vitals Group     BP      Pulse      Resp      Temp      Temp src      SpO2      Weight      Height      Head Circumference      Peak Flow      Pain Score      Pain Loc      Pain Edu?      Excl. in [place]?    No data found.  Updated Vital Signs BP 137/88   Pulse 68  Temp 98 F (36.7 C)   Resp 18   SpO2 99%   Visual Acuity Right Eye Distance:   Left Eye Distance:   Bilateral Distance:    Right Eye Near:   Left Eye Near:    Bilateral Near:     Physical Exam Vitals and nursing note reviewed.  Constitutional:      General: He is not in acute distress.    Appearance: Normal appearance. He is well-developed and normal weight. He is not ill-appearing or toxic-appearing.  HENT:     Head: Normocephalic and atraumatic.     Right Ear: Tympanic membrane, ear canal and external ear normal. There is no impacted cerumen.     Left Ear: Tympanic membrane, ear canal and external ear normal. There is no impacted cerumen.     Nose: Nose normal. No congestion.     Mouth/Throat:     Mouth: Mucous membranes are moist.     Pharynx: Oropharynx is clear.  Cardiovascular:     Rate and Rhythm: Normal rate and regular rhythm.     Pulses: Normal pulses.     Heart sounds: Normal heart sounds. No murmur.  Pulmonary:     Effort: Pulmonary effort is normal. No respiratory  distress.     Breath sounds: Normal breath sounds. No wheezing or rhonchi.  Chest:     Chest wall: No tenderness.  Musculoskeletal:     Cervical back: Neck supple.  Neurological:     General: No focal deficit present.     Mental Status: He is alert and oriented to person, place, and time.      UC Treatments / Results  Labs (all labs ordered are listed, but only abnormal results are displayed) Labs Reviewed  NOVEL CORONAVIRUS, NAA    EKG   Radiology No results found.  Procedures Procedures (including critical care time)  Medications Ordered in UC Medications - No data to display  Initial Impression / Assessment and Plan / UC Course  I have reviewed the triage vital signs and the nursing notes.  Pertinent labs & imaging results that were available during my care of the patient were reviewed by me and considered in my medical decision making (see chart for details).    COVID-19 test was ordered. Advised patient to quarantine To go to ED for worsening of symptoms  Final Clinical Impressions(s) / UC Diagnoses   Final diagnoses:  Suspected COVID-19 virus infection     Discharge Instructions     COVID testing ordered.  It will take between 2-7 days for test results.  Someone will contact you regarding abnormal results.    In the meantime: You should remain isolated in your home for 10 days from symptom onset AND greater than 72 hours after symptoms resolution (absence of fever without the use of fever-reducing medication and improvement in respiratory symptoms), whichever is longer Get plenty of rest and push fluids Flonase prescribed for nasal congestion and runny nose Use medications daily for symptom relief Use OTC medications like ibuprofen or tylenol as needed fever or pain Call or go to the ED if you have any new or worsening symptoms such as fever, worsening cough, shortness of breath, chest tightness, chest pain, turning blue, changes in mental status,  etc...     ED Prescriptions    Medication Sig Dispense Auth. Provider   fluticasone (FLONASE) 50 MCG/ACT nasal spray Place 1 spray into both nostrils daily for 14 days. 16 g Durward Parcel, FNP  PDMP not reviewed this encounter.   Durward Parcel, FNP 10/17/19 1905

## 2019-10-17 NOTE — ED Triage Notes (Signed)
Pt presents with complaints of nasal congestion and body aches that started on Sunday. Denies any known exposure to COVID.

## 2019-10-18 LAB — NOVEL CORONAVIRUS, NAA: SARS-CoV-2, NAA: DETECTED — AB

## 2019-10-20 ENCOUNTER — Telehealth (HOSPITAL_COMMUNITY): Payer: Self-pay | Admitting: Emergency Medicine

## 2019-10-20 NOTE — Telephone Encounter (Signed)

## 2019-10-20 NOTE — Telephone Encounter (Signed)
Patient contacted by phone and made aware of   pos covid results. Pt verbalized understanding and had all questions answered.    

## 2019-10-30 ENCOUNTER — Ambulatory Visit: Payer: Federal, State, Local not specified - PPO | Attending: Internal Medicine

## 2019-10-30 ENCOUNTER — Other Ambulatory Visit: Payer: Self-pay

## 2019-10-30 DIAGNOSIS — Z20822 Contact with and (suspected) exposure to covid-19: Secondary | ICD-10-CM

## 2019-10-31 LAB — NOVEL CORONAVIRUS, NAA: SARS-CoV-2, NAA: NOT DETECTED
